# Patient Record
Sex: Female | Born: 2005 | Race: Black or African American | Hispanic: No | Marital: Single | Smoking: Never smoker
Health system: Southern US, Community
[De-identification: ages and names within clinical notes are randomized; demographics above are authoritative.]

---
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
## 2024-02-19 ENCOUNTER — Encounter (HOSPITAL_BASED_OUTPATIENT_CLINIC_OR_DEPARTMENT_OTHER): Payer: Self-pay | Admitting: Emergency Medicine

## 2024-02-19 ENCOUNTER — Other Ambulatory Visit: Payer: Self-pay

## 2024-02-19 ENCOUNTER — Emergency Department (HOSPITAL_BASED_OUTPATIENT_CLINIC_OR_DEPARTMENT_OTHER): Admission: EM | Admit: 2024-02-19 | Discharge: 2024-02-19 | Disposition: A | Discharge status: Home / Self Care

## 2024-02-19 DIAGNOSIS — N390 Urinary tract infection, site not specified: Secondary | ICD-10-CM | POA: Insufficient documentation

## 2024-02-19 DIAGNOSIS — R1031 Right lower quadrant pain: Secondary | ICD-10-CM | POA: Diagnosis present

## 2024-02-19 LAB — COMPREHENSIVE METABOLIC PANEL WITH GFR
ALT: 14 U/L (ref 0–44)
AST: 17 U/L (ref 15–41)
Albumin: 4.5 g/dL (ref 3.5–5.0)
Alkaline Phosphatase: 59 U/L (ref 38–126)
Anion gap: 12 (ref 5–15)
BUN: <5 mg/dL — ABNORMAL LOW (ref 6–20)
CO2: 23 mmol/L (ref 22–32)
Calcium: 9.5 mg/dL (ref 8.9–10.3)
Chloride: 106 mmol/L (ref 98–111)
Creatinine, Ser: 0.78 mg/dL (ref 0.44–1.00)
GFR, Estimated: >60 mL/min (ref >60)
Glucose, Bld: 96 mg/dL (ref 70–99)
Potassium: 3.4 mmol/L — ABNORMAL LOW (ref 3.5–5.1)
Sodium: 141 mmol/L (ref 135–145)
Total Bilirubin: 0.5 mg/dL (ref 0.0–1.2)
Total Protein: 7 g/dL (ref 6.5–8.1)

## 2024-02-19 LAB — LIPASE, BLOOD: Lipase: 15 U/L (ref 11–51)

## 2024-02-19 LAB — URINALYSIS, ROUTINE W REFLEX MICROSCOPIC
Bilirubin Urine: NEGATIVE
Glucose, UA: NEGATIVE mg/dL
Ketones, ur: 15 mg/dL — AB
Nitrite: NEGATIVE
Protein, ur: >=300 mg/dL — AB
Specific Gravity, Urine: 1.02 (ref 1.005–1.030)
pH: 7.5 (ref 5.0–8.0)

## 2024-02-19 LAB — URINALYSIS, MICROSCOPIC (REFLEX): RBC / HPF: >50 RBC/hpf (ref 0–5)

## 2024-02-19 LAB — CBC
HCT: 33.1 % — ABNORMAL LOW (ref 36.0–46.0)
Hemoglobin: 10.5 g/dL — ABNORMAL LOW (ref 12.0–15.0)
MCH: 22.6 pg — ABNORMAL LOW (ref 26.0–34.0)
MCHC: 31.7 g/dL (ref 30.0–36.0)
MCV: 71.2 fL — ABNORMAL LOW (ref 80.0–100.0)
Platelets: 232 K/uL (ref 150–400)
RBC: 4.65 MIL/uL (ref 3.87–5.11)
RDW: 14.6 % (ref 11.5–15.5)
WBC: 10.1 K/uL (ref 4.0–10.5)
nRBC: 0 % (ref 0.0–0.2)

## 2024-02-19 LAB — PREGNANCY, URINE: Preg Test, Ur: NEGATIVE

## 2024-02-19 MED ORDER — KETOROLAC TROMETHAMINE 15 MG/ML IJ SOLN
15.0000 mg | Freq: Once | INTRAMUSCULAR | Status: DC
  Filled 2024-02-19: qty 1

## 2024-02-19 MED ORDER — CEPHALEXIN 250 MG PO CAPS
500.0000 mg | ORAL_CAPSULE | Freq: Once | ORAL | Status: AC
  Administered 2024-02-19: 500 mg via ORAL
  Filled 2024-02-19: qty 2

## 2024-02-19 MED ORDER — CEPHALEXIN 500 MG PO CAPS
500.0000 mg | ORAL_CAPSULE | Freq: Four times a day (QID) | ORAL | 0 refills | Status: AC
Start: 2024-02-19 — End: 2024-02-26

## 2024-02-19 NOTE — ED Notes (Addendum)
 This RN went to give pt IM toradol  injection and collect urine sample from pt. Pt's family was asking if pregnancy test had been run prior to pt getting a shot. This RN explained that the urine sample was going to be sent to obtain pregnancy results. However, the EDP would not order any medications that were not safe for pregnancy prior to obtaining pregnancy test results. Pt then expressed concern that the EDP was not aware of her pink-tinged urine. This RN showed EDP pink-tinged urine sample and reported pt's concern about taking toradol  at this time.

## 2024-02-19 NOTE — Discharge Instructions (Addendum)
 Take antibiotics as prescribed.  You can alternate Tylenol and Motrin  as needed for pain.

## 2024-02-19 NOTE — ED Triage Notes (Signed)
 Pt c/o RUQ abd pain since this AM.  Denies n/v/d/fever. Pain worse with movement.

## 2024-02-19 NOTE — ED Provider Notes (Signed)
 [facility] Provider Note   CSN: [id number] Arrival date & time: 02/19/24  1614     Patient presents with: Abdominal Pain   Virginia Reed is a 18 y.o. female.   18 year old female presents for evaluation of right lower abdominal pain.  Eating and drinking on arrival.  Has not had any nausea vomiting diarrhea or constipation.  States the pain comes and goes.  She has had some dysuria as well.  Denies any other symptoms or concerns.  Denies any vaginal discharge or pain.   Abdominal Pain Associated symptoms: no chest pain, no chills, no cough, no dysuria, no fever, no hematuria, no shortness of breath, no sore throat and no vomiting        Prior to Admission medications   Medication Sig Start Date End Date Taking? Authorizing Provider  cephALEXin  (KEFLEX ) 500 MG capsule Take 1 capsule (500 mg total) by mouth 4 (four) times daily for 7 days. 02/19/24 02/26/24 Yes Shirlena Brinegar L, DO  ondansetron  (ZOFRAN  ODT) 4 MG disintegrating tablet Take 1 tablet (4 mg total) by mouth every 8 (eight) hours as needed for nausea or vomiting. 01/23/17   Zackowski, Scott, MD    Allergies: Patient has no known allergies.    Review of Systems  Constitutional:  Negative for chills and fever.  HENT:  Negative for ear pain and sore throat.   Eyes:  Negative for pain and visual disturbance.  Respiratory:  Negative for cough and shortness of breath.   Cardiovascular:  Negative for chest pain and palpitations.  Gastrointestinal:  Positive for abdominal pain. Negative for vomiting.  Genitourinary:  Negative for dysuria and hematuria.  Musculoskeletal:  Negative for arthralgias and back pain.  Skin:  Negative for color change and rash.  Neurological:  Negative for seizures and syncope.  All other systems reviewed and are negative.   Updated Vital Signs BP (!) 145/111 (BP Location: Right Arm)   Pulse 60   Temp 98.1 F (36.7 C) (Oral)   Resp 16   Ht 5'  6 (1.676 m)   LMP 01/29/2024 (Approximate)   SpO2 100%   Physical Exam Vitals and nursing note reviewed.  Constitutional:      General: She is not in acute distress.    Appearance: She is well-developed. She is not ill-appearing.  HENT:     Head: Normocephalic and atraumatic.  Eyes:     Conjunctiva/sclera: Conjunctivae normal.  Cardiovascular:     Rate and Rhythm: Normal rate and regular rhythm.     Heart sounds: No murmur heard. Pulmonary:     Effort: Pulmonary effort is normal. No respiratory distress.     Breath sounds: Normal breath sounds.  Abdominal:     Palpations: Abdomen is soft.     Tenderness: There is abdominal tenderness in the right lower quadrant.  Musculoskeletal:        General: No swelling.     Cervical back: Neck supple.  Skin:    General: Skin is warm and dry.     Capillary Refill: Capillary refill takes less than 2 seconds.  Neurological:     Mental Status: She is alert.  Psychiatric:        Mood and Affect: Mood normal.     (all labs ordered are listed, but only abnormal results are displayed) Labs Reviewed  COMPREHENSIVE METABOLIC PANEL WITH GFR - Abnormal; Notable for the following components:      Result Value   Potassium 3.4 (*)  BUN <5 (*)    All other components within normal limits  CBC - Abnormal; Notable for the following components:   Hemoglobin 10.5 (*)    HCT 33.1 (*)    MCV 71.2 (*)    MCH 22.6 (*)    All other components within normal limits  URINALYSIS, ROUTINE W REFLEX MICROSCOPIC - Abnormal; Notable for the following components:   Color, Urine AMBER (*)    APPearance CLOUDY (*)    Hgb urine dipstick LARGE (*)    Ketones, ur 15 (*)    Protein, ur >=300 (*)    Leukocytes,Ua SMALL (*)    All other components within normal limits  URINALYSIS, MICROSCOPIC (REFLEX) - Abnormal; Notable for the following components:   Bacteria, UA MANY (*)    All other components within normal limits  LIPASE, BLOOD  PREGNANCY, URINE     EKG: None  Radiology: No results found.   Procedures   Medications Ordered in the ED  ketorolac  (TORADOL ) 15 MG/ML injection 15 mg (15 mg Intramuscular Not Given 02/19/24 1859)  cephALEXin  (KEFLEX ) capsule 500 mg (500 mg Oral Given 02/19/24 1922)                                    Medical Decision Making Patient with incredibly mild right lower quadrant tenderness to palpation she is not peritoneal, labs are unremarkable except for a UTI.  Will treat her for this.  I do not think she has an appendicitis at this time.  She is eating and drinking on arrival and otherwise appears very comfortable with stable vital signs.  Gave her Toradol  here.  Vies Tylenol Motrin  as needed for pain and close follow-up with primary care doctor.  Advised to return to the ER for new or worsening symptoms.  She was going discharged home.  Problems Addressed: Urinary tract infection with hematuria, site unspecified: acute illness or injury  Amount and/or Complexity of Data Reviewed External Data Reviewed: notes.    Details: Prior ED records reviewed patient seen in February 2025 for epigastric pain with negative workup Labs: ordered. Decision-making details documented in ED Course.    Details: Ordered and reviewed by me and no leukocytosis, patient does have a UTI  Risk OTC drugs. Prescription drug management.    Final diagnoses:  Urinary tract infection with hematuria, site unspecified    ED Discharge Orders          Ordered    cephALEXin  (KEFLEX ) 500 MG capsule  4 times daily        02/19/24 1914               Gennaro Duwaine CROME, DO 02/19/24 2101
